# Patient Record
Sex: Female | Born: 1975 | Race: Black or African American | Marital: Married | Smoking: Never smoker
Health system: Southern US, Community
[De-identification: ages and names within clinical notes are randomized; demographics above are authoritative.]

---

## 2015-10-02 ENCOUNTER — Other Ambulatory Visit: Payer: Self-pay | Admitting: Orthopedic Surgery

## 2015-10-02 DIAGNOSIS — M25561 Pain in right knee: Secondary | ICD-10-CM

## 2015-10-11 ENCOUNTER — Ambulatory Visit
Admission: RE | Admit: 2015-10-11 | Discharge: 2015-10-11 | Disposition: A | Discharge status: Home / Self Care | Payer: PRIVATE HEALTH INSURANCE | Source: Ambulatory Visit | Attending: Orthopedic Surgery | Admitting: Orthopedic Surgery

## 2015-10-11 DIAGNOSIS — M25561 Pain in right knee: Secondary | ICD-10-CM

## 2017-11-18 IMAGING — MR MR KNEE*R* W/O CM
4 of 6 series · 18 of 40 positions shown · non-contrast
Comparison: 08/31/2015 radiograph

CLINICAL DATA: Fall with persistent knee pain anteriorly and
locking sensation since 08/30/2015

EXAM:
MRI OF THE RIGHT KNEE WITHOUT CONTRAST
TECHNIQUE: Multiplanar, multisequence MR imaging of the knee was performed. No
intravenous contrast was administered.

[Series 3: PD fat-sat · axial · 4.0mm · 0.31mm/px · z∈[-64,+55]mm · 9 of 27 slices shown (1 of 4)]
[im 1/27]
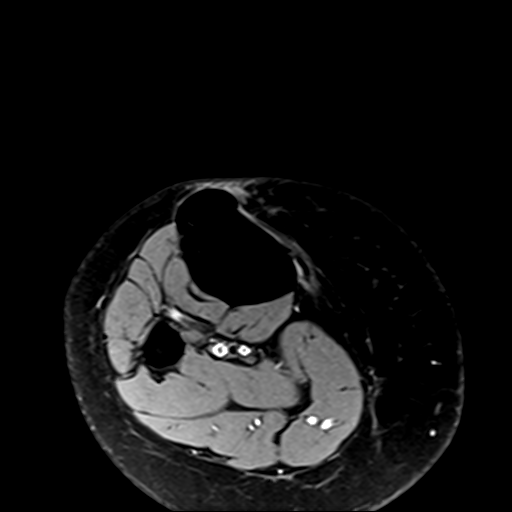
[im 4/27]
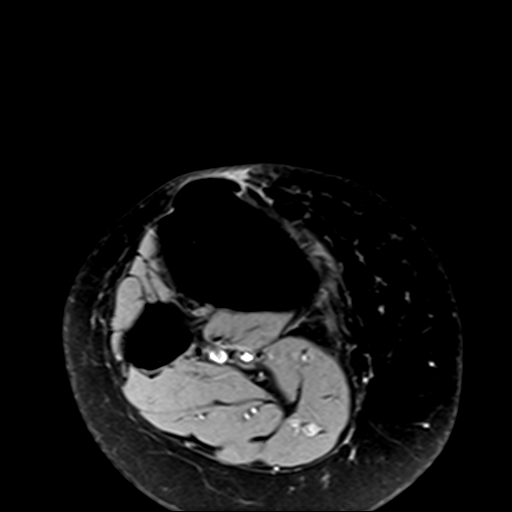
[im 7/27]
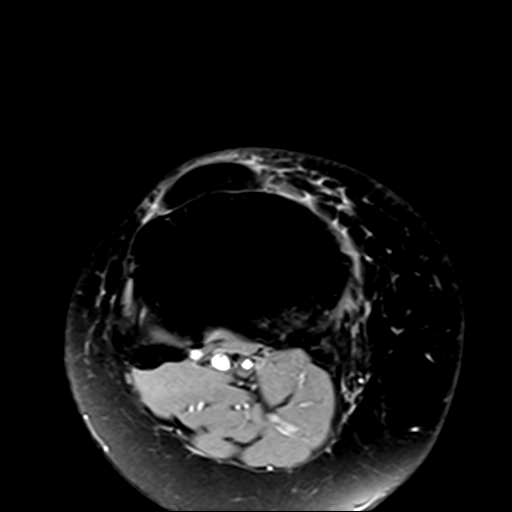
[im 10/27]
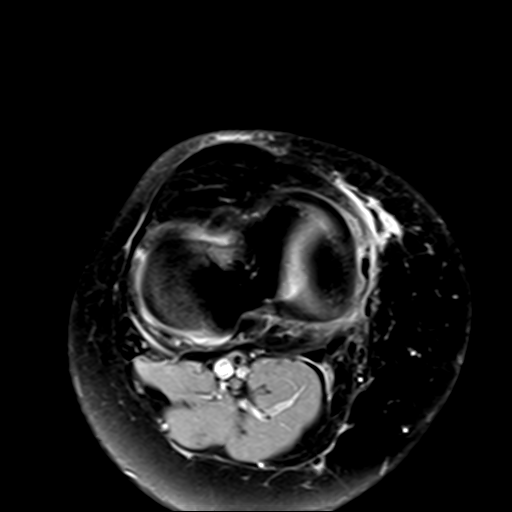
[im 14/27]
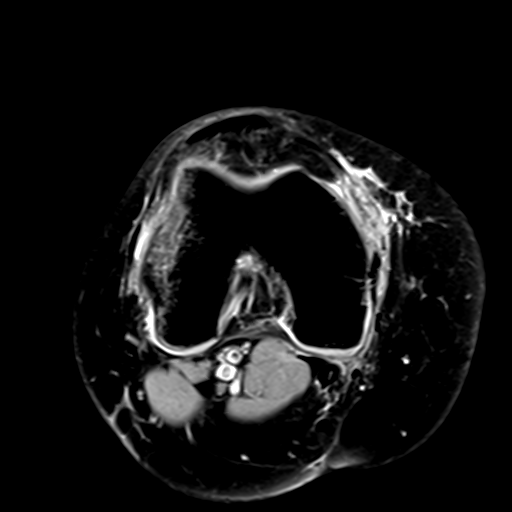
[im 17/27]
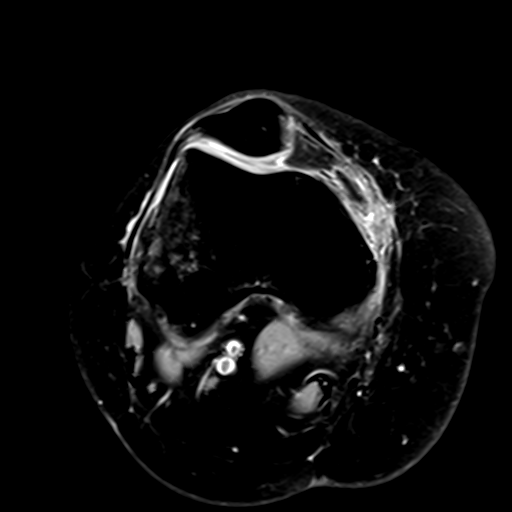
[im 20/27]
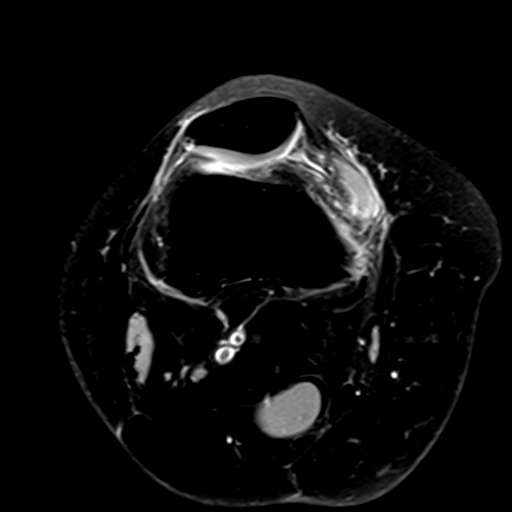
[im 23/27]
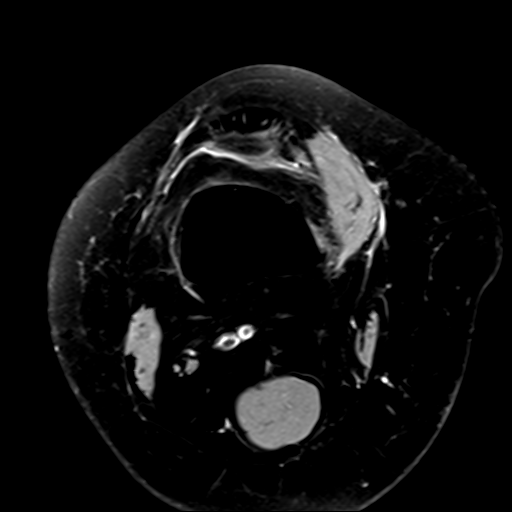
[im 27/27]
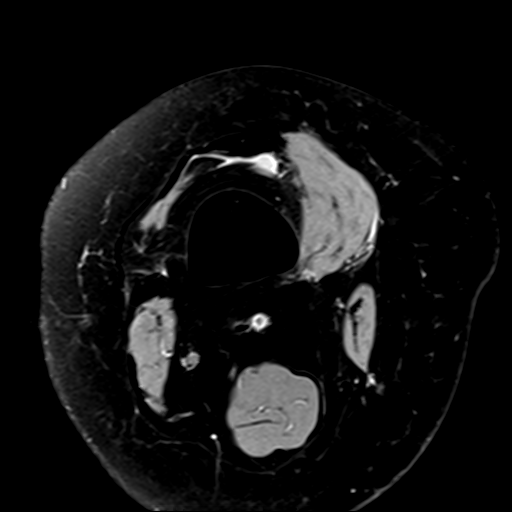

[Series 5: PD fat-sat · sagittal · 3.5mm · 0.31mm/px · 3 of 24 slices shown (2 of 4)]
[im 4/24]
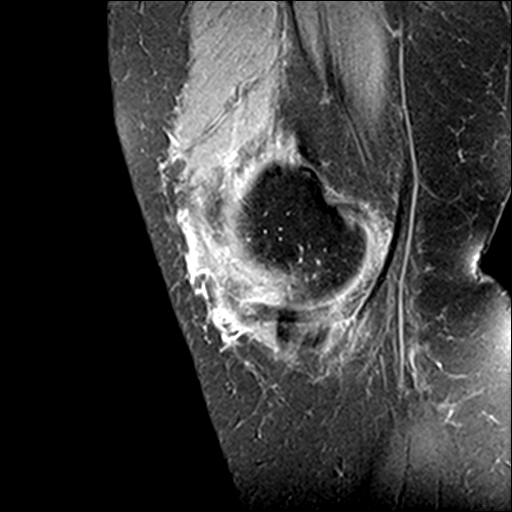
[im 12/24]
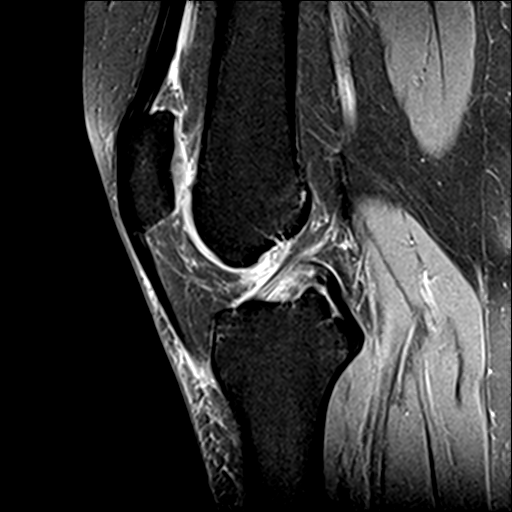
[im 20/24]
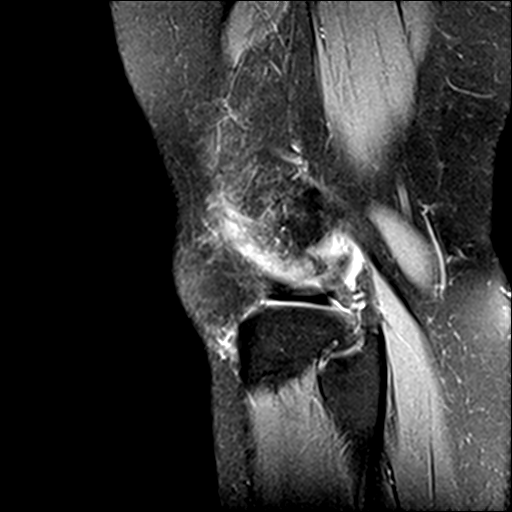

[Series 7: PD fat-sat · coronal · 3.5mm · 0.31mm/px · 3 of 23 slices shown (3 of 4)]
[im 4/23]
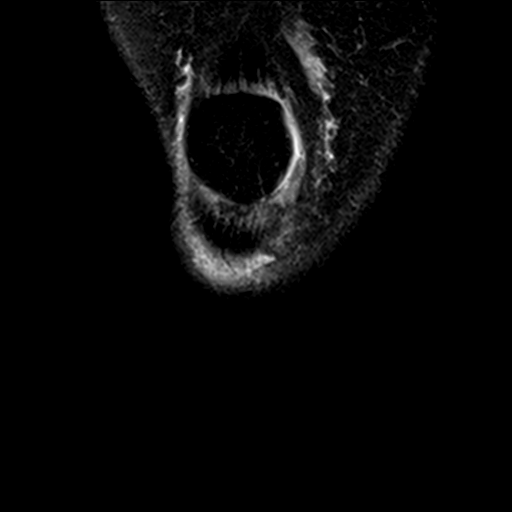
[im 12/23]
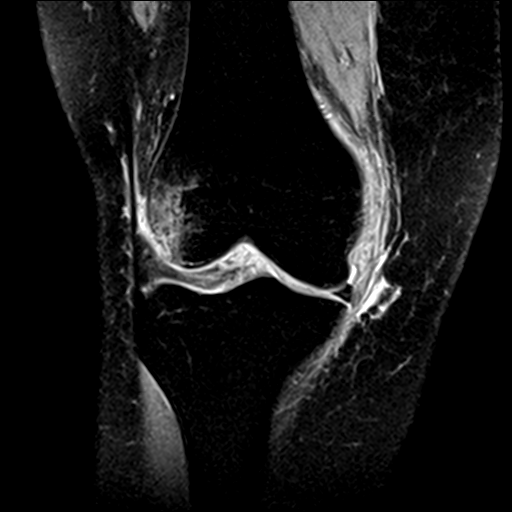
[im 19/23]
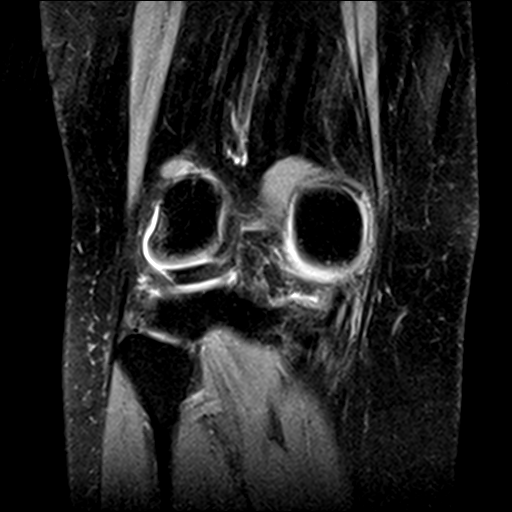

[Series 8: PD fat-sat · oblique · 2.0mm · 0.29mm/px · 3 of 11 slices shown (4 of 4)]
[im 1/11]
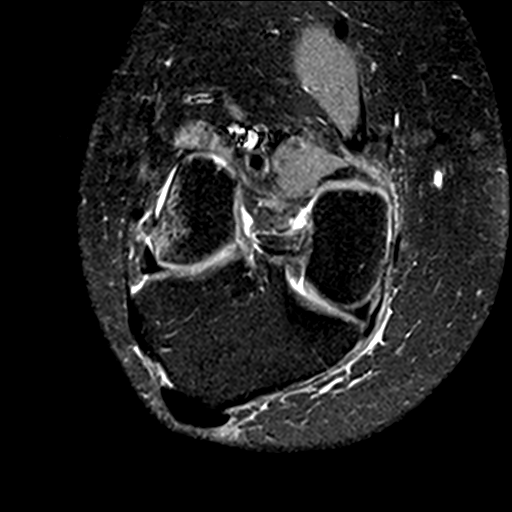
[im 6/11]
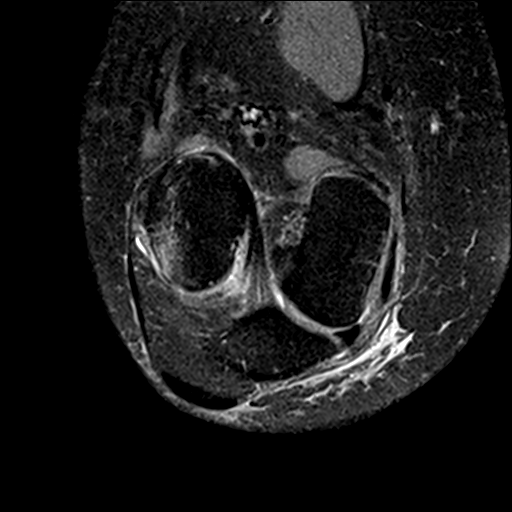
[im 11/11]
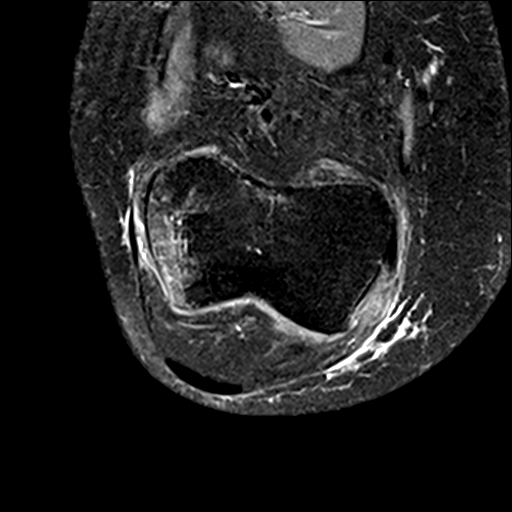

[18 of 40 positions shown; findings below may reference images not displayed]

FINDINGS: MENISCI

Medial meniscus:  Unremarkable

Lateral meniscus:  Unremarkable

LIGAMENTS

Cruciates: Faintly increased T2 signal centrally in the ACL
potentially reflecting mild degeneration. No tear. PCL intact.

Collaterals: Edema surrounds the medial collateral ligament, without
ligamentous discontinuity.

CARTILAGE

Patellofemoral: Shallow femoral trochlear groove. Mild chondral
thinning along the trochlear groove.

Medial: Mild degenerative chondral thinning. Bone bruising
posteriorly in the medial tibial plateau.

Lateral: Articular cartilage intact. Mild endosteal edema anteriorly
along the distal portion of the lateral femoral condyle.

Joint:  Small knee joint effusion.

Popliteal Fossa:  Very small Baker's cyst.

Extensor Mechanism: Torn medial patellofemoral ligament and torn
medial retinaculum posteriorly

Bones: Bone bruising along the anterolateral portion of the lateral
femoral condyle.

Other: No supplemental non-categorized findings.
IMPRESSION: 1. Constellation of findings suggesting prior patellar dislocation,
with marrow edema in the anterolateral portion of the lateral
femoral condyle, tear of the medial patellofemoral ligament, and
tear of the posterior portion of the medial patellar retinaculum. No
definite bone bruising in the patella.
2. Low-level bone bruising posteriorly in the medial tibial plateau.
3. Small knee effusion.
4. Low-grade sprain or more likely degeneration centrally in the
anterior cruciate ligament without overt tear.
5. Edema surrounds the medial collateral ligament and is probably
related to the retinacular tear, but could reflect a grade 1 sprain.
6. Shallow femoral trochlear groove may predispose to patellar
dislocation. Mild chondral thinning along the trochlear groove.
7. Minimal degenerative chondral thinning in the medial compartment.

## 2020-01-15 ENCOUNTER — Other Ambulatory Visit: Payer: Self-pay

## 2020-01-15 ENCOUNTER — Emergency Department
Admission: EM | Admit: 2020-01-15 | Discharge: 2020-01-15 | Disposition: A | Discharge status: Home / Self Care | Payer: PRIVATE HEALTH INSURANCE | Source: Home / Self Care

## 2020-01-15 DIAGNOSIS — J029 Acute pharyngitis, unspecified: Secondary | ICD-10-CM

## 2020-01-15 LAB — POCT RAPID STREP A (OFFICE): Rapid Strep A Screen: NEGATIVE

## 2020-01-15 NOTE — ED Provider Notes (Signed)
[facility]    CSN: [id number] Arrival date & time: 01/15/20  0825      History   Chief Complaint Chief Complaint  Patient presents with  . Sore Throat    HPI Sheila Mcdaniel is a 44 y.o. female.   HPI Sheila Mcdaniel is a 44 y.o. female presenting to UC with c/o sore throat that started 3-4 days ago.  She has tried various OTC medications with mild temporary relief.  Denies fever, chills, n/v/d. No known sick contacts.    History reviewed. No pertinent past medical history.  There are no problems to display for this patient.   History reviewed. No pertinent surgical history.  OB History   No obstetric history on file.      Home Medications    Prior to Admission medications   Medication Sig Start Date End Date Taking? Authorizing Provider  eletriptan (RELPAX) 40 MG tablet Take 40 mg by mouth as needed for migraine or headache. May repeat in 2 hours if headache persists or recurs.   Yes [provider]  esomeprazole (NEXIUM) 20 MG capsule Take 20 mg by mouth daily at 12 noon.   Yes [provider]  zonisamide (ZONEGRAN) 100 MG capsule Take 300 mg by mouth daily.   Yes [provider]    Family History Family History  Problem Relation Age of Onset  . Hypertension Mother   . Hypertension Father     Social History Social History   Tobacco Use  . Smoking status: Never Smoker  . Smokeless tobacco: Never Used  Substance Use Topics  . Alcohol use: Yes    Comment: occ  . Drug use: Not on file     Allergies   Amoxicillin and Naproxen   Review of Systems Review of Systems  Constitutional: Negative for chills and fever.  HENT: Positive for sore throat. Negative for congestion, ear pain, trouble swallowing and voice change.   Respiratory: Negative for cough and shortness of breath.   Cardiovascular: Negative for chest pain and palpitations.  Gastrointestinal: Negative for abdominal pain, diarrhea, nausea and vomiting.    Musculoskeletal: Negative for arthralgias, back pain and myalgias.  Skin: Negative for rash.  Neurological: Negative for dizziness, light-headedness and headaches.  All other systems reviewed and are negative.    Physical Exam Triage Vital Signs ED Triage Vitals  Enc Vitals Group     BP 01/15/20 0840 135/90     Pulse Rate 01/15/20 0840 98     Resp 01/15/20 0840 16     Temp 01/15/20 0840 98.4 F (36.9 C)     Temp Source 01/15/20 0840 Oral     SpO2 --      Weight --      Height --      Head Circumference --      Peak Flow --      Pain Score 01/15/20 0836 8     Pain Loc --      Pain Edu? --      Excl. in GC? --    No data found.  Updated Vital Signs BP 135/90 (BP Location: Right Arm)   Pulse 98   Temp 98.4 F (36.9 C) (Oral)   Resp 16   Visual Acuity Right Eye Distance:   Left Eye Distance:   Bilateral Distance:    Right Eye Near:   Left Eye Near:    Bilateral Near:     Physical Exam Vitals and nursing note reviewed.  Constitutional:  General: She is not in acute distress.    Appearance: She is well-developed. She is not ill-appearing, toxic-appearing or diaphoretic.  HENT:     Head: Normocephalic and atraumatic.     Right Ear: Tympanic membrane and ear canal normal.     Left Ear: Tympanic membrane and ear canal normal.     Nose: Nose normal.     Right Sinus: No maxillary sinus tenderness or frontal sinus tenderness.     Left Sinus: No maxillary sinus tenderness or frontal sinus tenderness.     Mouth/Throat:     Lips: Pink.     Mouth: Mucous membranes are moist.     Pharynx: Oropharynx is clear. Uvula midline. Posterior oropharyngeal erythema present. No pharyngeal swelling, oropharyngeal exudate or uvula swelling.     Tonsils: No tonsillar exudate or tonsillar abscesses.  Cardiovascular:     Rate and Rhythm: Normal rate and regular rhythm.  Pulmonary:     Effort: Pulmonary effort is normal. No respiratory distress.     Breath sounds: Normal breath  sounds. No stridor. No wheezing, rhonchi or rales.  Musculoskeletal:        General: Normal range of motion.     Cervical back: Normal range of motion and neck supple.  Lymphadenopathy:     Cervical: No cervical adenopathy.  Skin:    General: Skin is warm and dry.  Neurological:     Mental Status: She is alert and oriented to person, place, and time.  Psychiatric:        Behavior: Behavior normal.      UC Treatments / Results  Labs (all labs ordered are listed, but only abnormal results are displayed) Labs Reviewed  STREP A DNA PROBE  POCT RAPID STREP A (OFFICE)    EKG   Radiology No results found.  Procedures Procedures (including critical care time)  Medications Ordered in UC Medications - No data to display  Initial Impression / Assessment and Plan / UC Course  I have reviewed the triage vital signs and the nursing notes.  Pertinent labs & imaging results that were available during my care of the patient were reviewed by me and considered in my medical decision making (see chart for details).    Rapid strep: NEGATIVE Culture sent Encouraged symptomatic tx AVS given  Final Clinical Impressions(s) / UC Diagnoses   Final diagnoses:  Pharyngitis, unspecified etiology     Discharge Instructions      You may take 500mg  acetaminophen every 4-6 hours or in combination with ibuprofen 400-600mg  every 6-8 hours as needed for pain, inflammation, and fever.  Be sure to well hydrated with clear liquids and get at least 8 hours of sleep at night, preferably more while sick.   Please follow up with family medicine in 1 week if needed.     ED Prescriptions    None     PDMP not reviewed this encounter.   , Lurene Shadow 01/15/20 [phone]

## 2020-01-15 NOTE — ED Triage Notes (Signed)
Patient presents to Urgent Care with complaints of sore throat since about 3-4 days ago. Patient reports she has been trying various otc medications, thinks it is the change in weather causing her symptoms, denies fevers or seeing white patches at the back of her throat.

## 2020-01-15 NOTE — Discharge Instructions (Signed)
  You may take 500mg acetaminophen every 4-6 hours or in combination with ibuprofen 400-600mg every 6-8 hours as needed for pain, inflammation, and fever.  Be sure to well hydrated with clear liquids and get at least 8 hours of sleep at night, preferably more while sick.   Please follow up with family medicine in 1 week if needed.   

## 2020-01-17 LAB — STREP A DNA PROBE: Group A Strep Probe: NOT DETECTED
